# Patient Record
Sex: Female | Born: 1975 | Race: White | Hispanic: No | Marital: Married | Smoking: Current every day smoker
Health system: Southern US, Community
[De-identification: ages and names within clinical notes are randomized; demographics above are authoritative.]

## PROBLEM LIST (undated history)

## (undated) DIAGNOSIS — F419 Anxiety disorder, unspecified: Secondary | ICD-10-CM

## (undated) DIAGNOSIS — F329 Major depressive disorder, single episode, unspecified: Secondary | ICD-10-CM

## (undated) DIAGNOSIS — F32A Depression, unspecified: Secondary | ICD-10-CM

## (undated) HISTORY — PX: KNEE SURGERY: SHX244

## (undated) HISTORY — DX: Major depressive disorder, single episode, unspecified: F32.9

## (undated) HISTORY — PX: BREAST SURGERY: SHX581

## (undated) HISTORY — DX: Depression, unspecified: F32.A

## (undated) HISTORY — DX: Anxiety disorder, unspecified: F41.9

---

## 2010-05-17 HISTORY — PX: FOOT SURGERY: SHX648

## 2013-06-14 ENCOUNTER — Encounter (INDEPENDENT_AMBULATORY_CARE_PROVIDER_SITE_OTHER): Payer: Self-pay | Admitting: General Surgery

## 2013-06-14 ENCOUNTER — Ambulatory Visit (INDEPENDENT_AMBULATORY_CARE_PROVIDER_SITE_OTHER): Payer: BC Managed Care – PPO | Admitting: General Surgery

## 2013-06-14 ENCOUNTER — Encounter (INDEPENDENT_AMBULATORY_CARE_PROVIDER_SITE_OTHER): Payer: Self-pay

## 2013-06-14 VITALS — BP 120/77 | HR 68 | Temp 97.8°F | Resp 14 | Ht 61.0 in | Wt 134.2 lb

## 2013-06-14 DIAGNOSIS — R920 Mammographic microcalcification found on diagnostic imaging of breast: Secondary | ICD-10-CM

## 2013-06-16 NOTE — Progress Notes (Signed)
Patient ID: Karen Burns, female   DOB: [birth year], 38 y.o.   MRN: [id number]  Chief Complaint  Patient presents with  . New Evaluation    eval abnormal mgm    HPI Karen Burns is a 38 y.o. female.   HPI 40 yof who has history of breast cancer in a maternal grandmother who underwent mm in 3/14 with finding of heterogenously dense breasts.  She had left breast calcifications noted in the upper outer and the lower inner quadrant.  Additional views showed these were likely benign calcifications.  She underwent six month follow up in 9/14 that shows no detectable change in the left breast calcs and was scheduled to have her annual mm in March 2015.  She does not report any complaints referable to either breast. She does not really do breast exam as she states they are so "lumpy" already.  She has no nipple discharge.  She has never had biopsy. She comes in today to discuss calcs and options  Past Medical History  Diagnosis Date  . Anxiety   . Depression     Past Surgical History  Procedure Laterality Date  . Foot surgery  2012    right foot/ball in foot  . Knee surgery    . Breast surgery      fibroid cyst removed left side    Family History  Problem Relation Age of Onset  . Heart disease Father   . Cancer Maternal Grandmother     colon and breast    Social History History  Substance Use Topics  . Smoking status: Current Every Day Smoker -- 1.00 packs/day  . Smokeless tobacco: Never Used  . Alcohol Use: 1.8 oz/week    3 Glasses of wine per week     Comment: socially    Allergies  Allergen Reactions  . Sulfur Hives and Swelling    Current Outpatient Prescriptions  Medication Sig Dispense Refill  . citalopram (CELEXA) 40 MG tablet Take 40 mg by mouth daily.      . clonazePAM (KLONOPIN) 0.5 MG tablet Take 0.5 mg by mouth 2 (two) times daily as needed for anxiety.      . lamoTRIgine (LAMICTAL) 25 MG tablet Take 25 mg by mouth 2 (two) times daily.      Marland Kitchen venlafaxine  (EFFEXOR) 50 MG tablet Take 75 mg by mouth once.        No current facility-administered medications for this visit.    Review of Systems Review of Systems  Constitutional: Negative for fever, chills and unexpected weight change.  HENT: Negative for congestion, hearing loss, sore throat, trouble swallowing and voice change.   Eyes: Negative for visual disturbance.  Respiratory: Negative for cough and wheezing.   Cardiovascular: Negative for chest pain, palpitations and leg swelling.  Gastrointestinal: Negative for nausea, vomiting, abdominal pain, diarrhea, constipation, blood in stool, abdominal distention and anal bleeding.  Genitourinary: Negative for hematuria, vaginal bleeding and difficulty urinating.  Musculoskeletal: Negative for arthralgias.  Skin: Negative for rash and wound.  Neurological: Negative for seizures, syncope and headaches.  Hematological: Negative for adenopathy. Does not bruise/bleed easily.  Psychiatric/Behavioral: Negative for confusion.    Blood pressure 120/77, pulse 68, temperature 97.8 F (36.6 C), temperature source Temporal, resp. rate 14, height 5\' 1"  (1.549 m), weight 134 lb 3.2 oz (60.873 kg).  Physical Exam Physical Exam  Constitutional: [name].  Neck: Neck supple.  Cardiovascular: Normal rate, regular rhythm and normal heart sounds.   Pulmonary/Chest: Effort  normal and breath sounds normal. Right breast exhibits no inverted nipple, no mass, no nipple discharge, no skin change and no tenderness. Left breast exhibits no inverted nipple, no mass, no nipple discharge, no skin change and no tenderness.    Lymphadenopathy:    She has no cervical adenopathy.    She has no axillary adenopathy.       Right: No supraclavicular adenopathy present.       Left: No supraclavicular adenopathy present.    Data Reviewed Mm/us reviewed from [facility]  Assessment    Left breast calcifications     Plan    I have  reviewed reports and mm.  She does have category d dense breasts making mm less sensitive.  Looks like the six month follow up would be fine but I will discuss with radiology follow up with tomosynthesis or MR given calcs, mild fh and her breast density then will call her.        Karen Burns 06/16/2013, 2:17 PM

## 2013-09-04 ENCOUNTER — Telehealth (INDEPENDENT_AMBULATORY_CARE_PROVIDER_SITE_OTHER): Payer: Self-pay

## 2013-09-04 NOTE — Telephone Encounter (Signed)
LMOM returning pt's call to call me back.

## 2013-09-05 ENCOUNTER — Encounter (INDEPENDENT_AMBULATORY_CARE_PROVIDER_SITE_OTHER): Payer: Self-pay

## 2013-09-05 NOTE — Telephone Encounter (Signed)
LMOM for Karen Burns to see if she would fax over the mgm reports from 2014 [facility] b/c for some reason we don't have them scanned into Epic from the pt's visit with Karen Burns. I also requested the mgm films to be sent to Karen Burns for review.

## 2013-09-05 NOTE — Telephone Encounter (Signed)
Pt called me back today to discuss her last appt with Dr Dwain SarnaWakefield and her 3D mgm she had done 07/13/13 with [facility]. The pt is wanting Dr Doreen SalvageWakefield's opinion on the 3D mgm and the left br u/s that was [facility]. The pt is concerned about the information b/c first the mgm mentioned left br calcifications that is why she had a 58mo f/u 3D mgm this year now the report is stating a mass in the left breast which recommended the u/s. The u/s findings say the left br mass is likely a lymph node and is probably benign. [facility] recommends now another 58mo. u/s f/u but the pt is just not feeling good about watching something in her breast for another 58mo. The pt thinks this is all benign like they are telling her but she is still concerned about watching something that could turn into something anytime. I advised pt that I would send this message to Dr Dwain SarnaWakefield for him to review next week when he returns to clinic but he will probably want to see her again in office. The pt would like for me to ask him first before making an appt. I will call [facility] to request her films to be sent to us for Dr Dwain SarnaWakefield to review. I will call pt back. The pt understands.

## 2013-09-11 NOTE — Telephone Encounter (Signed)
LMOM for pt to call me back so I can discuss what Dr Dwain SarnaWakefield wants the pt to do.

## 2013-09-12 NOTE — Telephone Encounter (Signed)
Pt returned my call. I advised pt that Dr Dwain SarnaWakefield would like to see her again in the office to go over the mgm reports from [facility]. I advised pt that Dr Dwain SarnaWakefield did look over her reports but he wants to see her for a breast check. The pt is fine with this plan. The pt requested appt for next Tuesday 5/5 b/c she is already off work. I make the appt for 09/18/13 @ 9:00.

## 2013-09-18 ENCOUNTER — Encounter (INDEPENDENT_AMBULATORY_CARE_PROVIDER_SITE_OTHER): Payer: Self-pay | Admitting: General Surgery

## 2013-09-18 ENCOUNTER — Ambulatory Visit (INDEPENDENT_AMBULATORY_CARE_PROVIDER_SITE_OTHER): Payer: BC Managed Care – PPO | Admitting: General Surgery

## 2013-09-18 VITALS — BP 110/70 | HR 72 | Resp 16 | Ht 61.0 in | Wt 135.0 lb

## 2013-09-18 DIAGNOSIS — R928 Other abnormal and inconclusive findings on diagnostic imaging of breast: Secondary | ICD-10-CM

## 2013-09-18 NOTE — Patient Instructions (Signed)

## 2013-09-20 NOTE — Progress Notes (Signed)
Subjective:     Patient ID: Karen Burns, female   DOB: 10/15/1975, 38 y.o.   MRN: [id number]  HPI This is a 38 year old female who I know from a prior visit. She has a family history of breast cancer in a maternal grandmother. She has no real complaints related to her breast but does not really do her exams well. She has no nipple discharge. She initially underwent a mammogram in March of 2014 that showed left breast calcifications. This was followed by magnification views which showed that these appear to be benign calcifications. An ultrasound showed some cysts but there were no other abnormalities. She was recommended a six-month followup at that time. She was followed up in September of 2014 where the calcifications were probably benign and these were followed up again in 6 months. In February 2015 she underwent an additional mammogram. Her breast density was C area and this is read as no significant masses calcifications. There is a small mass in the left breast at 6:00. This underwent ultrasound and appears to be a lymph node that appears to be benign also. She was recommended an ultrasound in 6 months to demonstrate stability of this. She was concerned about this and sought a second opinion which is why she is her today.  Review of Systems     Objective:   Physical Exam  Constitutional: [name].  Pulmonary/Chest: Right breast exhibits no inverted nipple, no mass, no nipple discharge, no skin change and no tenderness. Left breast exhibits no inverted nipple, no mass, no nipple discharge, no skin change and no tenderness.  Lymphadenopathy:    She has no cervical adenopathy.    She has no axillary adenopathy.       Right: No supraclavicular adenopathy present.       Left: No supraclavicular adenopathy present.       Assessment:     Left breast calcifications Right breast likely im node     Plan:     I think it is reasonable to continue following  these radiologically. These all look benign and reasonable to follow up.  I don't think she needs anything further right now. We discussed there is good chance she might continue to get these frequent follow ups .  We decided to get the follow up us and then I would be happy to see her after this.

## 2015-08-04 ENCOUNTER — Ambulatory Visit (INDEPENDENT_AMBULATORY_CARE_PROVIDER_SITE_OTHER): Payer: BLUE CROSS/BLUE SHIELD | Admitting: Internal Medicine

## 2015-08-04 VITALS — BP 118/68 | HR 83 | Temp 98.0°F | Resp 16 | Ht 62.0 in | Wt 144.0 lb

## 2015-08-04 DIAGNOSIS — J3489 Other specified disorders of nose and nasal sinuses: Secondary | ICD-10-CM | POA: Diagnosis not present

## 2015-08-04 DIAGNOSIS — M791 Myalgia: Secondary | ICD-10-CM | POA: Diagnosis not present

## 2015-08-04 DIAGNOSIS — M609 Myositis, unspecified: Secondary | ICD-10-CM

## 2015-08-04 DIAGNOSIS — IMO0001 Reserved for inherently not codable concepts without codable children: Secondary | ICD-10-CM

## 2015-08-04 LAB — POCT INFLUENZA A/B
INFLUENZA A, POC: POSITIVE — AB
INFLUENZA B, POC: NEGATIVE

## 2015-08-04 NOTE — Patient Instructions (Signed)
     IF you received an x-ray today, you will receive an invoice from [facility]. Please contact [facility] at [phone] with questions or concerns regarding your invoice.   IF you received labwork today, you will receive an invoice from [facility]. Please contact [facility] at [phone] with questions or concerns regarding your invoice.   Our billing staff will not be able to assist you with questions regarding bills from these companies.  You will be contacted with the lab results as soon as they are available. The fastest way to get your results is to activate your My Chart account. Instructions are located on the last page of this paperwork. If you have not heard from us regarding the results in 2 weeks, please contact this office.      

## 2015-08-04 NOTE — Progress Notes (Signed)
_  Subjective:  By signing my name below, I, Karen Burns, attest that this documentation has been prepared under the direction and in the presence of Karen Szatkowski P. Merla Richesoolittle, MD. Electronically Signed: Linus GalasMaharshi Burns, ED Scribe. 08/04/2015. 3:31 PM.   Patient ID: Karen Burns, female    DOB: 05/08/1976, 40 y.o.   MRN: [id number] Chief Complaint  Patient presents with  . Sinus Problem    x 2 days  . Nasal Congestion  . Sore Throat  . ear pain    both  . Headache  . Torticollis   HPI  HPI Comments: Karen Morellemanda Karen Burns is a 40 y.o. female who presents to the [facility] complaining  of flu-like symptoms that began 2 days ago. Pt reports HA, otalgia, sore throat, neck stiffness, sneezing, congestion,postnasal drip, cough and chills. Pt tried ibuprofen and Goody's with no relief. Pt denies fever, change in appetite, nausea, vomiting, or any other symptoms at this time.   Pt works at [employer].  Review of Systems  Constitutional: Positive for chills. Negative for fever and appetite change.  HENT: Positive for congestion, ear pain, postnasal drip, sneezing and sore throat.   Respiratory: Positive for cough.   Gastrointestinal: Negative for nausea and vomiting.  Musculoskeletal: Positive for neck stiffness.  Neurological: Positive for headaches.      Objective:   Physical Exam  Constitutional: She is oriented to person, place, and time. [name].  HENT:  Head: Normocephalic and atraumatic.  Right Ear: Tympanic membrane and ear canal normal.  Left Ear: Tympanic membrane and ear canal normal.  Nose: Nose normal.  Mouth/Throat: Oropharynx is clear and moist.  Cardiovascular: Normal rate.   Pulmonary/Chest: Effort normal and breath sounds normal.  Abdominal: She exhibits no distension.  Neurological: She is alert and oriented to person, place, and time.  Skin: Skin is warm and dry.  Psychiatric: She has a normal mood and  affect.  Nursing note and vitals reviewed. BP 118/68 mmHg  Pulse 83  Temp(Src) 98 F (36.7 C) (Oral)  Resp 16  Ht 5\' 2"  (1.575 m)  Wt 144 lb (65.318 kg)  BMI 26.33 kg/m2  SpO2 98%  LMP 08/04/2015 (Exact Date)  Results for orders placed or performed in visit on 08/04/15  POCT Influenza A/B  Result Value Ref Range   Influenza A, POC Positive (A) Negative   Influenza B, POC Negative Negative       Assessment & Plan:  Rhinorrhea  Myalgia and myositis - Plan: POCT Influenza A/B  oow 3d otc meds   I have completed the patient encounter in its entirety as documented by the scribe, with editing by me where necessary. Karen Burns P. Merla Burns, M.D.

## 2015-08-25 ENCOUNTER — Ambulatory Visit
Admission: RE | Admit: 2015-08-25 | Discharge: 2015-08-25 | Disposition: A | Discharge status: Home / Self Care | Payer: BLUE CROSS/BLUE SHIELD | Source: Ambulatory Visit | Attending: Family Medicine | Admitting: Family Medicine

## 2015-08-25 ENCOUNTER — Other Ambulatory Visit: Payer: Self-pay | Admitting: Family Medicine

## 2015-08-25 DIAGNOSIS — M25511 Pain in right shoulder: Secondary | ICD-10-CM

## 2016-02-24 ENCOUNTER — Other Ambulatory Visit: Payer: Self-pay | Admitting: Family Medicine

## 2016-02-24 DIAGNOSIS — N939 Abnormal uterine and vaginal bleeding, unspecified: Secondary | ICD-10-CM

## 2016-03-03 ENCOUNTER — Ambulatory Visit
Admission: RE | Admit: 2016-03-03 | Discharge: 2016-03-03 | Disposition: A | Discharge status: Home / Self Care | Payer: BLUE CROSS/BLUE SHIELD | Source: Ambulatory Visit | Attending: Family Medicine | Admitting: Family Medicine

## 2016-03-03 DIAGNOSIS — N939 Abnormal uterine and vaginal bleeding, unspecified: Secondary | ICD-10-CM

## 2016-11-09 IMAGING — CR DG SHOULDER 2+V*R*
3 series · 3 of 3 positions shown · non-contrast
Comparison: None.

CLINICAL DATA: Right shoulder pain for 6 months. Decreased range of
motion.

EXAM:
RIGHT SHOULDER - 2+ VIEW

[w shoulder grashey right]
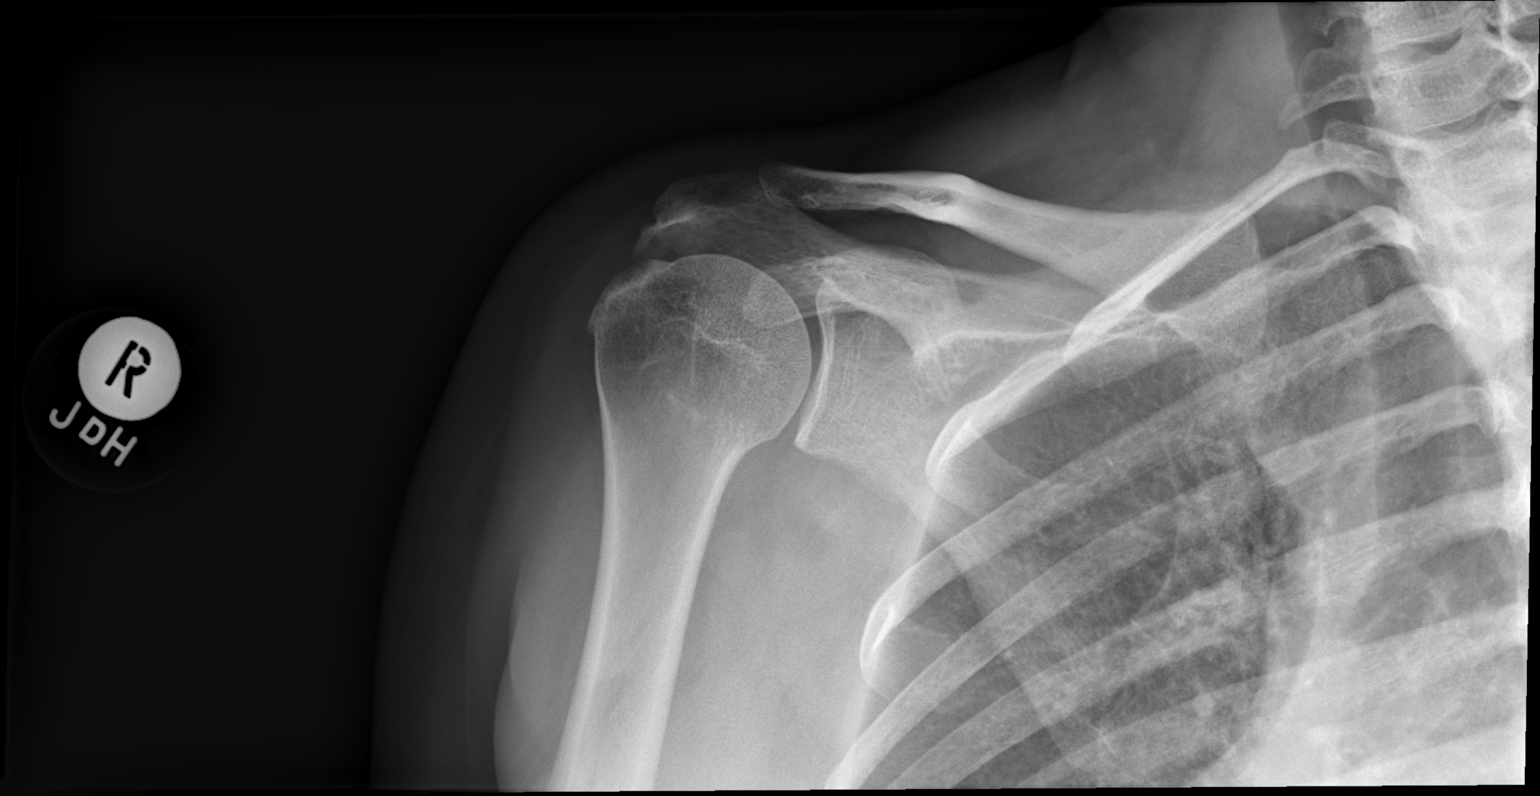

[w shoulder y-view right]
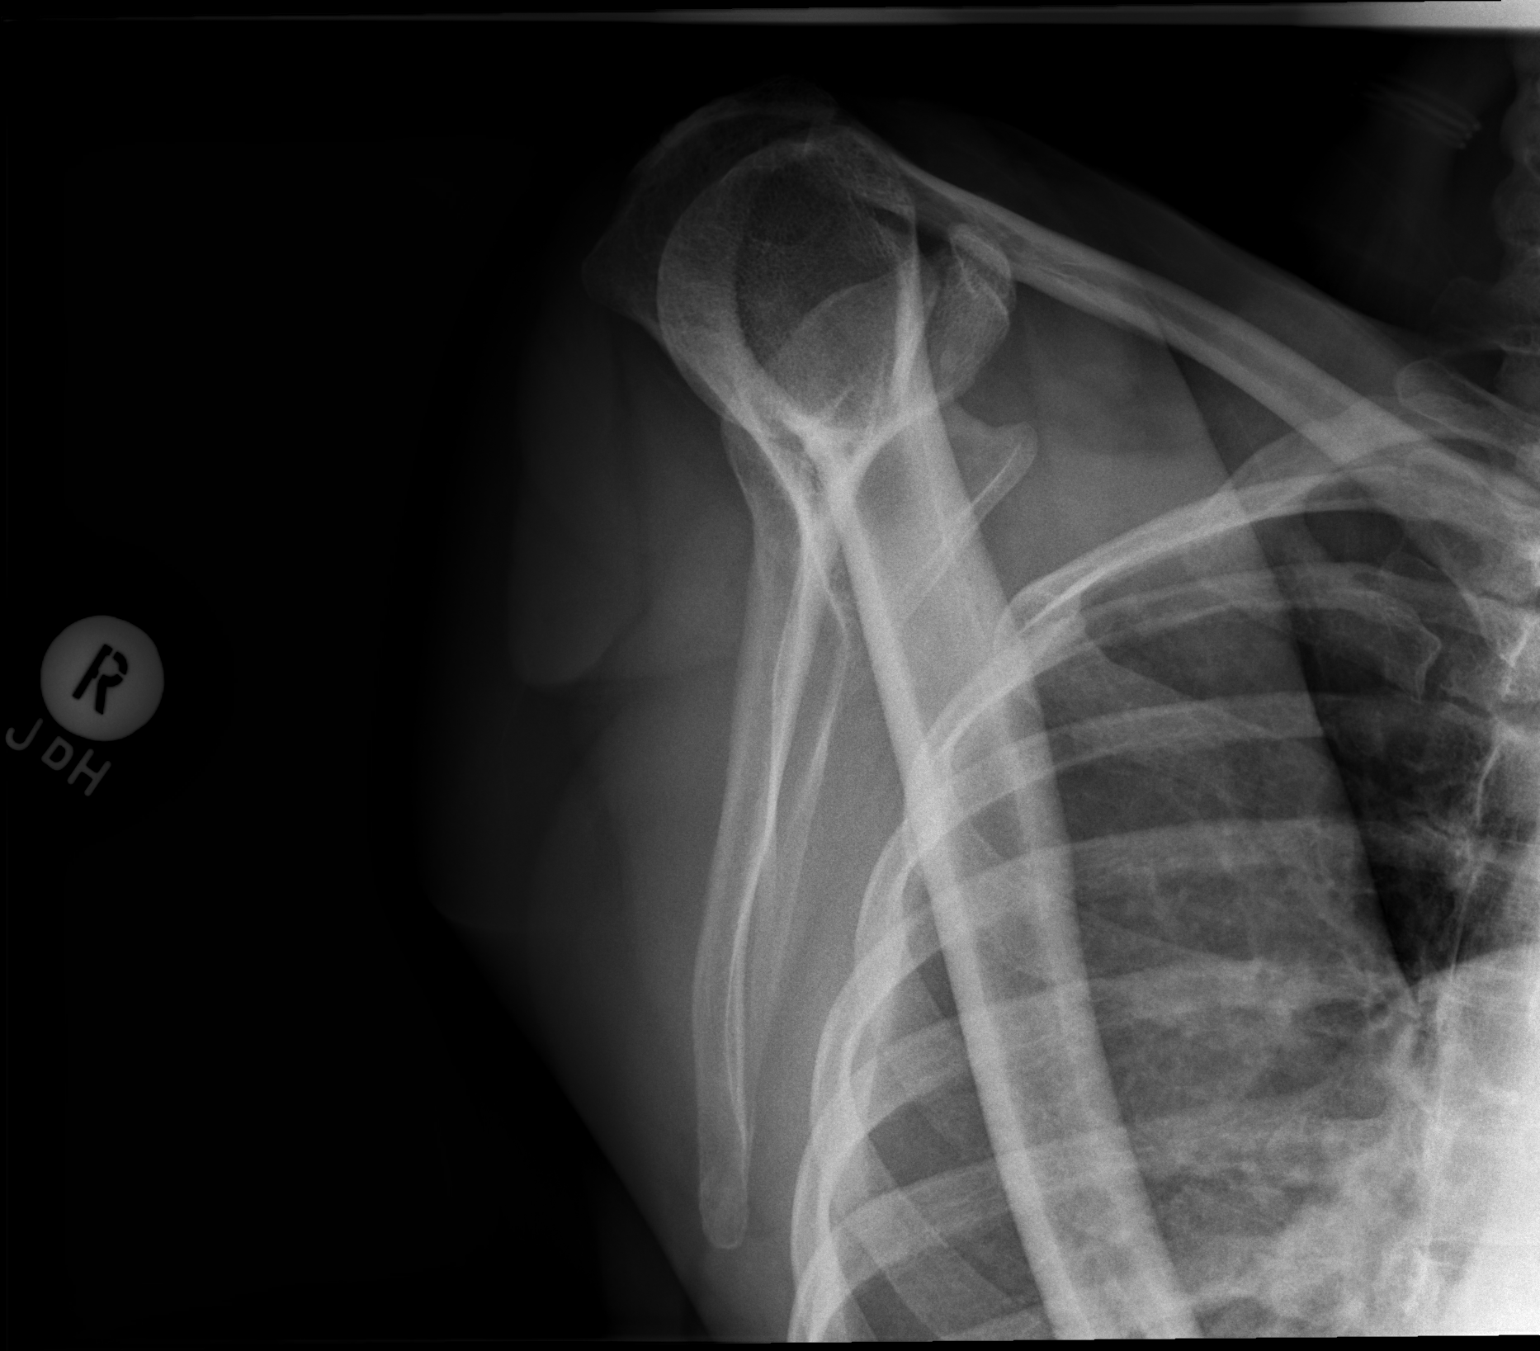

[w shoulder axillary right]
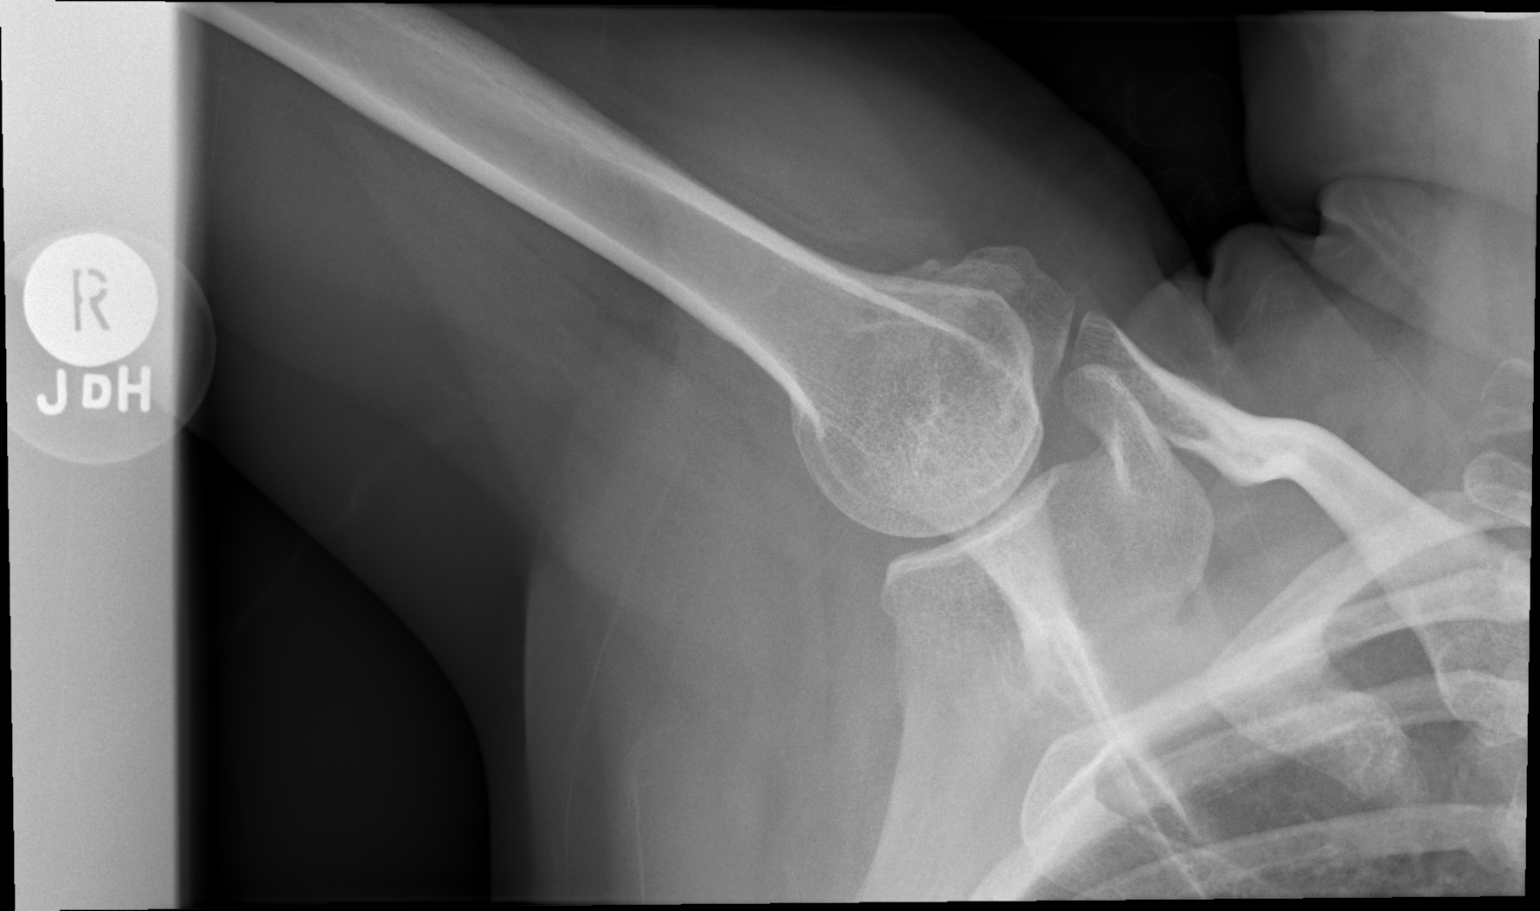

[3 of 3 positions shown; findings below may reference images not displayed]

FINDINGS: There are moderate degenerative changes in the right AC and
glenohumeral joints with joint space narrowing. Subacromial
spurring. Spurring noted at the rotator cuff insertion. No acute
bony abnormality. Specifically, no fracture, subluxation, or
dislocation. Soft tissues are intact.
IMPRESSION: Moderate degenerative changes in the right shoulder. No acute
findings.

## 2020-03-06 ENCOUNTER — Other Ambulatory Visit: Payer: Self-pay | Admitting: Obstetrics and Gynecology

## 2020-03-06 DIAGNOSIS — M19011 Primary osteoarthritis, right shoulder: Secondary | ICD-10-CM

## 2020-03-06 DIAGNOSIS — M4800 Spinal stenosis, site unspecified: Secondary | ICD-10-CM

## 2020-03-06 DIAGNOSIS — M779 Enthesopathy, unspecified: Secondary | ICD-10-CM

## 2020-03-26 ENCOUNTER — Ambulatory Visit
Admission: RE | Admit: 2020-03-26 | Discharge: 2020-03-26 | Disposition: A | Discharge status: Home / Self Care | Payer: BC Managed Care – PPO | Source: Ambulatory Visit | Attending: Obstetrics and Gynecology | Admitting: Obstetrics and Gynecology

## 2020-03-26 ENCOUNTER — Other Ambulatory Visit: Payer: Self-pay

## 2020-03-26 DIAGNOSIS — M779 Enthesopathy, unspecified: Secondary | ICD-10-CM

## 2020-03-26 DIAGNOSIS — M4800 Spinal stenosis, site unspecified: Secondary | ICD-10-CM

## 2020-03-26 DIAGNOSIS — M19011 Primary osteoarthritis, right shoulder: Secondary | ICD-10-CM
